# Patient Record
Sex: Female | Born: 1985 | Race: White | Hispanic: No | Marital: Single | Smoking: Never smoker
Health system: Southern US, Community
[De-identification: ages and names within clinical notes are randomized; demographics above are authoritative.]

## PROBLEM LIST (undated history)

## (undated) DIAGNOSIS — F509 Eating disorder, unspecified: Secondary | ICD-10-CM

## (undated) HISTORY — PX: BREAST SURGERY: SHX581

## (undated) HISTORY — PX: CHOLECYSTECTOMY: SHX55

---

## 2012-05-22 ENCOUNTER — Encounter (HOSPITAL_COMMUNITY): Payer: Self-pay | Admitting: Nurse Practitioner

## 2012-05-22 ENCOUNTER — Emergency Department (HOSPITAL_COMMUNITY): Payer: BC Managed Care – PPO

## 2012-05-22 ENCOUNTER — Emergency Department (HOSPITAL_COMMUNITY)
Admission: EM | Admit: 2012-05-22 | Discharge: 2012-05-23 | Disposition: A | Discharge status: Home / Self Care | Payer: BC Managed Care – PPO | Attending: Emergency Medicine | Admitting: Emergency Medicine

## 2012-05-22 DIAGNOSIS — Y9210 Unspecified residential institution as the place of occurrence of the external cause: Secondary | ICD-10-CM | POA: Insufficient documentation

## 2012-05-22 DIAGNOSIS — K8020 Calculus of gallbladder without cholecystitis without obstruction: Secondary | ICD-10-CM

## 2012-05-22 DIAGNOSIS — IMO0002 Reserved for concepts with insufficient information to code with codable children: Secondary | ICD-10-CM | POA: Insufficient documentation

## 2012-05-22 DIAGNOSIS — Y93B9 Activity, other involving muscle strengthening exercises: Secondary | ICD-10-CM | POA: Insufficient documentation

## 2012-05-22 DIAGNOSIS — Z8659 Personal history of other mental and behavioral disorders: Secondary | ICD-10-CM | POA: Insufficient documentation

## 2012-05-22 DIAGNOSIS — Z3202 Encounter for pregnancy test, result negative: Secondary | ICD-10-CM | POA: Insufficient documentation

## 2012-05-22 DIAGNOSIS — K8050 Calculus of bile duct without cholangitis or cholecystitis without obstruction: Secondary | ICD-10-CM

## 2012-05-22 DIAGNOSIS — R109 Unspecified abdominal pain: Secondary | ICD-10-CM | POA: Insufficient documentation

## 2012-05-22 HISTORY — DX: Eating disorder, unspecified: F50.9

## 2012-05-22 LAB — COMPREHENSIVE METABOLIC PANEL
Albumin: 4 g/dL (ref 3.5–5.2)
BUN: 11 mg/dL (ref 6–23)
Calcium: 9.4 mg/dL (ref 8.4–10.5)
Chloride: 101 mEq/L (ref 96–112)
Creatinine, Ser: 0.62 mg/dL (ref 0.50–1.10)
Total Bilirubin: 0.7 mg/dL (ref 0.3–1.2)
Total Protein: 7.1 g/dL (ref 6.0–8.3)

## 2012-05-22 LAB — URINALYSIS, ROUTINE W REFLEX MICROSCOPIC
Glucose, UA: NEGATIVE mg/dL
Hgb urine dipstick: NEGATIVE
Ketones, ur: NEGATIVE mg/dL
Protein, ur: NEGATIVE mg/dL
Urobilinogen, UA: 0.2 mg/dL (ref 0.0–1.0)

## 2012-05-22 LAB — CBC WITH DIFFERENTIAL/PLATELET
Basophils Relative: 0 % (ref 0–1)
Eosinophils Absolute: 0.1 10*3/uL (ref 0.0–0.7)
Eosinophils Relative: 1 % (ref 0–5)
HCT: 38.4 % (ref 36.0–46.0)
Hemoglobin: 12.9 g/dL (ref 12.0–15.0)
MCH: 29.7 pg (ref 26.0–34.0)
MCHC: 33.6 g/dL (ref 30.0–36.0)
MCV: 88.3 fL (ref 78.0–100.0)
Monocytes Absolute: 1 10*3/uL (ref 0.1–1.0)
Monocytes Relative: 9 % (ref 3–12)

## 2012-05-22 LAB — LIPASE, BLOOD: Lipase: 40 U/L (ref 11–59)

## 2012-05-22 MED ORDER — IBUPROFEN 200 MG PO TABS
400.0000 mg | ORAL_TABLET | Freq: Once | ORAL | Status: AC
  Administered 2012-05-22: 400 mg via ORAL
  Filled 2012-05-22: qty 1

## 2012-05-22 MED ORDER — ACETAMINOPHEN 500 MG PO TABS
1000.0000 mg | ORAL_TABLET | Freq: Once | ORAL | Status: AC
  Administered 2012-05-22: 1000 mg via ORAL
  Filled 2012-05-22: qty 2

## 2012-05-22 NOTE — Progress Notes (Signed)
Pt noted without pcp CM spoke with pt who confirms self pay [employer] resident. CM discussed and provided written information importance of pcp for f/u care, www.needymeds.org, discounted pharmacies, and other [place] resources such as financial assistance, DSS and  health department Reviewed Health connect number to assist with finding self pay provider close to pt's residence. Reviewed resources for [facility], general medical clinics, [facility] out patient [facility]ies, housing, and other resources in [place]. Pt voiced understanding and appreciation of resources provided

## 2012-05-22 NOTE — ED Notes (Signed)
US at bedside

## 2012-05-22 NOTE — ED Notes (Signed)
Patient transported to X-ray 

## 2012-05-22 NOTE — ED Notes (Signed)
Per EMS: Pt reported doing a handstand in her dorm room at [employer] when she suddenly felt a "tearing" in her chest.  Reported 3/10 to EMS.  She reports an allergy to Vicodin and denies any medical history.

## 2012-05-22 NOTE — Consult Note (Signed)
Reason for Consult:ABDOMINAL PAIN Referring Physician: [facility]  Erin Davenport is an 27 y.o. female.  HPI: Aske dto see patient at the request of Dr Verne Spurr for RUQ abdominal pain and gallstones by U/S.  The patient is pain free.   Had RUQ pain earlier today after some exercise.  Has GS on U/S.  Has had RUQ pain on / off for years with nausea.  No food trigger.  Past Medical History  Diagnosis Date  . Eating disorder     anorexia in highschool    History reviewed. No pertinent past surgical history.  Family History  Problem Relation Age of Onset  . Hypertension Mother   . Sleep apnea Mother   . Hypertension Father   . Heart disease Other     Social History:  reports that she has never smoked. She does not have any smokeless tobacco history on file. She reports that she does not drink alcohol or use illicit drugs.  Allergies:  Allergies  Allergen Reactions  . Vicodin (Hydrocodone-Acetaminophen) Nausea And Vomiting    Medications: I have reviewed the patient's current medications. Prior to Admission:  (Not in a hospital admission)  Results for orders placed during the hospital encounter of 05/22/12 (from the past 48 hour(s))  CBC WITH DIFFERENTIAL     Status: Abnormal   Collection Time   05/22/12  4:35 PM      Component Value Range Comment   WBC 11.4 (*) 4.0 - 10.5 K/uL    RBC 4.35  3.87 - 5.11 MIL/uL    Hemoglobin 12.9  12.0 - 15.0 g/dL    HCT 98.1  19.1 - 47.8 %    MCV 88.3  78.0 - 100.0 fL    MCH 29.7  26.0 - 34.0 pg    MCHC 33.6  30.0 - 36.0 g/dL    RDW 29.5  62.1 - 30.8 %    Platelets 242  150 - 400 K/uL    Neutrophils Relative 68  43 - 77 %    Neutro Abs 7.7  1.7 - 7.7 K/uL    Lymphocytes Relative 22  12 - 46 %    Lymphs Abs 2.5  0.7 - 4.0 K/uL    Monocytes Relative 9  3 - 12 %    Monocytes Absolute 1.0  0.1 - 1.0 K/uL    Eosinophils Relative 1  0 - 5 %    Eosinophils Absolute 0.1  0.0 - 0.7 K/uL    Basophils Relative 0  0 - 1 %    Basophils Absolute 0.0   0.0 - 0.1 K/uL   COMPREHENSIVE METABOLIC PANEL     Status: Abnormal   Collection Time   05/22/12  4:35 PM      Component Value Range Comment   Sodium 138  135 - 145 mEq/L    Potassium 4.5  3.5 - 5.1 mEq/L    Chloride 101  96 - 112 mEq/L    CO2 27  19 - 32 mEq/L    Glucose, Bld 104 (*) 70 - 99 mg/dL    BUN 11  6 - 23 mg/dL    Creatinine, Ser 6.57  0.50 - 1.10 mg/dL    Calcium 9.4  8.4 - 84.6 mg/dL    Total Protein 7.1  6.0 - 8.3 g/dL    Albumin 4.0  3.5 - 5.2 g/dL    AST 962 (*) 0 - 37 U/L    ALT 86 (*) 0 - 35 U/L    Alkaline  Phosphatase 86  39 - 117 U/L    Total Bilirubin 0.7  0.3 - 1.2 mg/dL    GFR calc non Af Amer >90  >90 mL/min    GFR calc Af Amer >90  >90 mL/min   LIPASE, BLOOD     Status: Normal   Collection Time   05/22/12  4:35 PM      Component Value Range Comment   Lipase 40  11 - 59 U/L   URINALYSIS, ROUTINE W REFLEX MICROSCOPIC     Status: Abnormal   Collection Time   05/22/12  5:55 PM      Component Value Range Comment   Color, Urine YELLOW  YELLOW    APPearance CLOUDY (*) CLEAR    Specific Gravity, Urine 1.011  1.005 - 1.030    pH 7.5  5.0 - 8.0    Glucose, UA NEGATIVE  NEGATIVE mg/dL    Hgb urine dipstick NEGATIVE  NEGATIVE    Bilirubin Urine NEGATIVE  NEGATIVE    Ketones, ur NEGATIVE  NEGATIVE mg/dL    Protein, ur NEGATIVE  NEGATIVE mg/dL    Urobilinogen, UA 0.2  0.0 - 1.0 mg/dL    Nitrite NEGATIVE  NEGATIVE    Leukocytes, UA NEGATIVE  NEGATIVE MICROSCOPIC NOT DONE ON URINES WITH NEGATIVE PROTEIN, BLOOD, LEUKOCYTES, NITRITE, OR GLUCOSE <1000 mg/dL.  PREGNANCY, URINE     Status: Normal   Collection Time   05/22/12  5:55 PM      Component Value Range Comment   Preg Test, Ur NEGATIVE  NEGATIVE     US Abdomen Complete  05/22/2012  *RADIOLOGY REPORT*  Clinical Data:  27 year old female with abdominal pain and elevated LFTs.  ABDOMINAL ULTRASOUND COMPLETE  Comparison:  None  Findings:  Gallbladder: Multiple mobile gallstones are identified, the largest  measuring 2 cm in diameter.  Mild gallbladder wall thickening and equivocal sonographic Murphy's sign noted.  There is no evidence of pericholecystic fluid.  Common Bile Duct:  There is no evidence of intrahepatic or extrahepatic biliary dilation. The CBD measures 3.0 mm in greatest diameter.  Liver:  The liver is within normal limits in parenchymal echogenicity. No focal abnormalities are identified.  IVC:  Appears normal.  Pancreas:  Although the pancreas is difficult to visualize in its entirety, no focal pancreatic abnormality is identified.  Spleen:  Within normal limits in size and echotexture.  Right kidney:  The right kidney is normal in size and parenchymal echogenicity.  There is no evidence of solid mass, hydronephrosis or definite renal calculi.  The right kidney measures 11.1 cm.  Left kidney:  The left kidney is normal in size and parenchymal echogenicity.  There is no evidence of solid mass, hydronephrosis or definite renal calculi.   The left kidney measures 10.3 cm.  Abdominal Aorta:  No abdominal aortic aneurysm identified.  There is no evidence of ascites.  IMPRESSION: Cholelithiasis with mild gallbladder wall thickening and questionable sonographic Murphy's sign - equivocal for acute cholecystitis. No evidence of biliary dilatation.   Original Report Authenticated By: Harmon Pier, M.D.    Dg Abd Acute W/chest  05/22/2012  *RADIOLOGY REPORT*  Clinical Data: Abdominal pain  ACUTE ABDOMEN SERIES (ABDOMEN 2 VIEW & CHEST 1 VIEW)  Comparison: None.  Findings: Normal heart size and vascularity.  Lungs clear.  No effusions or pneumothorax.  Trachea is midline.  No free air. Moderate retained stool throughout the colon compatible with constipation.  Negative for obstruction or free air.  No abnormal calcification or  osseous abnormality.  Pelvis appears intact.  IMPRESSION: Moderate colonic stool burden compatible with constipation.  Negative for obstruction or free air.  No acute chest process.    Original Report Authenticated By: Judie Petit. Miles Costain, M.D.     Review of Systems  Constitutional: Negative.   HENT: Negative.   Respiratory: Negative.   Cardiovascular: Negative.   Gastrointestinal: Positive for nausea, vomiting and abdominal pain.  Genitourinary: Negative.   Musculoskeletal: Negative.   Skin: Negative.   Neurological: Negative.   Endo/Heme/Allergies: Negative.   Psychiatric/Behavioral: Negative.    Blood pressure 98/60, pulse 64, temperature 98.5 F (36.9 C), temperature source Oral, resp. rate 18, last menstrual period 05/11/2012, SpO2 97.00%. Physical Exam  Constitutional: She is oriented to person, place, and time. She appears well-developed and well-nourished.  HENT:  Head: Normocephalic and atraumatic.  Eyes: EOM are normal. Pupils are equal, round, and reactive to light.  Neck: Normal range of motion.  GI: Soft. Bowel sounds are normal. She exhibits no distension. There is no tenderness.  Musculoskeletal: Normal range of motion.  Neurological: She is alert and oriented to person, place, and time.  Skin: Skin is warm and dry.  Psychiatric: She has a normal mood and affect. Her behavior is normal. Judgment and thought content normal.    Assessment/Plan: Biliary colic Asymptomatic at this point.  Wants surgery in [place] closer to family.  OK to D/C  And have pt follow up closer to home for elective cholecystectomy.  Return if symptoms return.  Low fat diet.  Xaria Judon A. 05/22/2012, 10:56 PM

## 2012-05-22 NOTE — ED Provider Notes (Signed)
History     CSN: [id number]  Arrival date & time 05/22/12  1611   First MD Initiated Contact with Patient 05/22/12 1612      Chief Complaint  Patient presents with  . Abdominal Injury     HPI Pt was seen at 1625.   Per pt, c/o gradual onset and persistence of constant upper abd "pain" that began this afternoon PTA.  States the pain began after she was "doing a handstand" and "hanging abd crunches" in her dorm room.  Describes the abd pain as "sore."  Denies N/V/D, no fevers, no back pain, no rash, no CP/SOB, no black or blood in stools or emesis.       Past Medical History  Diagnosis Date  . Eating disorder     anorexia in highschool    History reviewed. No pertinent past surgical history.  Family History  Problem Relation Age of Onset  . Hypertension Mother   . Sleep apnea Mother   . Hypertension Father   . Heart disease Other     History  Substance Use Topics  . Smoking status: Never Smoker   . Smokeless tobacco: Not on file  . Alcohol Use: No      Review of Systems ROS: Statement: All systems negative except as marked or noted in the HPI; Constitutional: Negative for fever and chills. ; ; Eyes: Negative for eye pain, redness and discharge. ; ; ENMT: Negative for ear pain, hoarseness, nasal congestion, sinus pressure and sore throat. ; ; Cardiovascular: Negative for chest pain, palpitations, diaphoresis, dyspnea and peripheral edema. ; ; Respiratory: Negative for cough, wheezing and stridor. ; ; Gastrointestinal: +abd pain. Negative for nausea, vomiting, diarrhea, blood in stool, hematemesis, jaundice and rectal bleeding. . ; ; Genitourinary: Negative for dysuria, flank pain and hematuria. ; ; Musculoskeletal: Negative for back pain and neck pain. Negative for swelling and trauma.; ; Skin: Negative for pruritus, rash, abrasions, blisters, bruising and skin lesion.; ; Neuro: Negative for headache, lightheadedness and neck stiffness. Negative for weakness, altered level  of consciousness , altered mental status, extremity weakness, paresthesias, involuntary movement, seizure and syncope.       Allergies  Vicodin  Home Medications  No current outpatient prescriptions on file.  BP 104/63  Pulse 67  Temp 98.5 F (36.9 C) (Oral)  Resp 21  SpO2 100%  LMP 05/11/2012  Physical Exam 1630: Physical examination:  Nursing notes reviewed; Vital signs and O2 SAT reviewed;  Constitutional: Well developed, Well nourished, Well hydrated, In no acute distress; Head:  Normocephalic, atraumatic; Eyes: EOMI, PERRL, No scleral icterus; ENMT: Mouth and pharynx normal, Mucous membranes moist; Neck: Supple, Full range of motion, No lymphadenopathy; Cardiovascular: Regular rate and rhythm, No murmur, rub, or gallop; Respiratory: Breath sounds clear & equal bilaterally, No rales, rhonchi, wheezes.  Speaking full sentences with ease, Normal respiratory effort/excursion; Chest: Nontender, Movement normal; Abdomen: Soft, +mild RLQ, mid-epigastric, LUQ tenderness to palp. No rebound or guarding. Nondistended, Normal bowel sounds; Genitourinary: No CVA tenderness; Extremities: Pulses normal, No tenderness, No edema, No calf edema or asymmetry.; Neuro: AA&Ox3, Major CN grossly intact.  Speech clear. No gross focal motor or sensory deficits in extremities.; Skin: Color normal, Warm, Dry.   ED Course  Procedures    MDM  MDM Reviewed: nursing note and vitals Interpretation: labs and ultrasound     Results for orders placed during the hospital encounter of 05/22/12  URINALYSIS, ROUTINE W REFLEX MICROSCOPIC      Component Value Range  Color, Urine YELLOW  YELLOW   APPearance CLOUDY (*) CLEAR   Specific Gravity, Urine 1.011  1.005 - 1.030   pH 7.5  5.0 - 8.0   Glucose, UA NEGATIVE  NEGATIVE mg/dL   Hgb urine dipstick NEGATIVE  NEGATIVE   Bilirubin Urine NEGATIVE  NEGATIVE   Ketones, ur NEGATIVE  NEGATIVE mg/dL   Protein, ur NEGATIVE  NEGATIVE mg/dL   Urobilinogen, UA 0.2   0.0 - 1.0 mg/dL   Nitrite NEGATIVE  NEGATIVE   Leukocytes, UA NEGATIVE  NEGATIVE  PREGNANCY, URINE      Component Value Range   Preg Test, Ur NEGATIVE  NEGATIVE  CBC WITH DIFFERENTIAL      Component Value Range   WBC 11.4 (*) 4.0 - 10.5 K/uL   RBC 4.35  3.87 - 5.11 MIL/uL   Hemoglobin 12.9  12.0 - 15.0 g/dL   HCT 04.5  40.9 - 81.1 %   MCV 88.3  78.0 - 100.0 fL   MCH 29.7  26.0 - 34.0 pg   MCHC 33.6  30.0 - 36.0 g/dL   RDW 91.4  78.2 - 95.6 %   Platelets 242  150 - 400 K/uL   Neutrophils Relative 68  43 - 77 %   Neutro Abs 7.7  1.7 - 7.7 K/uL   Lymphocytes Relative 22  12 - 46 %   Lymphs Abs 2.5  0.7 - 4.0 K/uL   Monocytes Relative 9  3 - 12 %   Monocytes Absolute 1.0  0.1 - 1.0 K/uL   Eosinophils Relative 1  0 - 5 %   Eosinophils Absolute 0.1  0.0 - 0.7 K/uL   Basophils Relative 0  0 - 1 %   Basophils Absolute 0.0  0.0 - 0.1 K/uL  COMPREHENSIVE METABOLIC PANEL      Component Value Range   Sodium 138  135 - 145 mEq/L   Potassium 4.5  3.5 - 5.1 mEq/L   Chloride 101  96 - 112 mEq/L   CO2 27  19 - 32 mEq/L   Glucose, Bld 104 (*) 70 - 99 mg/dL   BUN 11  6 - 23 mg/dL   Creatinine, Ser 2.13  0.50 - 1.10 mg/dL   Calcium 9.4  8.4 - 08.6 mg/dL   Total Protein 7.1  6.0 - 8.3 g/dL   Albumin 4.0  3.5 - 5.2 g/dL   AST 578 (*) 0 - 37 U/L   ALT 86 (*) 0 - 35 U/L   Alkaline Phosphatase 86  39 - 117 U/L   Total Bilirubin 0.7  0.3 - 1.2 mg/dL   GFR calc non Af Amer >90  >90 mL/min   GFR calc Af Amer >90  >90 mL/min  LIPASE, BLOOD      Component Value Range   Lipase 40  11 - 59 U/L   US Abdomen Complete 05/22/2012  *RADIOLOGY REPORT*  Clinical Data:  27 year old female with abdominal pain and elevated LFTs.  ABDOMINAL ULTRASOUND COMPLETE  Comparison:  None  Findings:  Gallbladder: Multiple mobile gallstones are identified, the largest measuring 2 cm in diameter.  Mild gallbladder wall thickening and equivocal sonographic Murphy's sign noted.  There is no evidence of pericholecystic  fluid.  Common Bile Duct:  There is no evidence of intrahepatic or extrahepatic biliary dilation. The CBD measures 3.0 mm in greatest diameter.  Liver:  The liver is within normal limits in parenchymal echogenicity. No focal abnormalities are identified.  IVC:  Appears normal.  Pancreas:  Although the pancreas is difficult to visualize in its entirety, no focal pancreatic abnormality is identified.  Spleen:  Within normal limits in size and echotexture.  Right kidney:  The right kidney is normal in size and parenchymal echogenicity.  There is no evidence of solid mass, hydronephrosis or definite renal calculi.  The right kidney measures 11.1 cm.  Left kidney:  The left kidney is normal in size and parenchymal echogenicity.  There is no evidence of solid mass, hydronephrosis or definite renal calculi.   The left kidney measures 10.3 cm.  Abdominal Aorta:  No abdominal aortic aneurysm identified.  There is no evidence of ascites.  IMPRESSION: Cholelithiasis with mild gallbladder wall thickening and questionable sonographic Murphy's sign - equivocal for acute cholecystitis. No evidence of biliary dilatation.   Original Report Authenticated By: Harmon Pier, M.D.    Dg Abd Acute W/chest 05/22/2012  *RADIOLOGY REPORT*  Clinical Data: Abdominal pain  ACUTE ABDOMEN SERIES (ABDOMEN 2 VIEW & CHEST 1 VIEW)  Comparison: None.  Findings: Normal heart size and vascularity.  Lungs clear.  No effusions or pneumothorax.  Trachea is midline.  No free air. Moderate retained stool throughout the colon compatible with constipation.  Negative for obstruction or free air.  No abnormal calcification or osseous abnormality.  Pelvis appears intact.  IMPRESSION: Moderate colonic stool burden compatible with constipation.  Negative for obstruction or free air.  No acute chest process.   Original Report Authenticated By: Judie Petit. Shick, M.D.      2200:  LFT's mildly elevated, no old to compare.  [place] equivocal for acute cholecystitis.  VSS,  afebrile, abd benign on re-exam. Has tol PO well while in the ED without N/V.  Further d/w pt regarding her symptoms: pt states she has had intermittent upper abd pain "for years," usually associated with nausea; has adjusted her diet (avoiding greasy/fatty/fried and acidic foods).  Dx and testing d/w pt.  Questions answered.  Verb understanding, agreeable to eval by General Surgeon while in the ED.  T/C to General Surgeon Dr. Luisa Hart, case discussed, including:  HPI, pertinent PM/SHx, VS/PE, dx testing, ED course and treatment:  Agreeable to eval in the ED.   2230:  Surgery has eval in ED:  States pt wants to go home to [place] to have elective cholecystectomy, OK to d/c to f/u as outpt.        Laray Anger, DO 05/24/12 1343

## 2012-05-22 NOTE — ED Notes (Signed)
Bedside report received from previous RN 

## 2012-05-23 MED ORDER — TRAMADOL HCL 50 MG PO TABS: 50.0000 mg | ORAL_TABLET | Freq: Four times a day (QID) | ORAL | Status: AC | PRN

## 2012-05-23 MED ORDER — ONDANSETRON HCL 4 MG PO TABS: 4.0000 mg | ORAL_TABLET | Freq: Three times a day (TID) | ORAL | Status: AC | PRN

## 2012-05-30 ENCOUNTER — Ambulatory Visit (INDEPENDENT_AMBULATORY_CARE_PROVIDER_SITE_OTHER): Payer: BC Managed Care – PPO | Admitting: Surgery

## 2013-06-04 ENCOUNTER — Encounter (HOSPITAL_COMMUNITY): Payer: Self-pay | Admitting: Emergency Medicine

## 2013-06-04 ENCOUNTER — Emergency Department (INDEPENDENT_AMBULATORY_CARE_PROVIDER_SITE_OTHER): Payer: BC Managed Care – PPO

## 2013-06-04 ENCOUNTER — Emergency Department (INDEPENDENT_AMBULATORY_CARE_PROVIDER_SITE_OTHER)
Admission: EM | Admit: 2013-06-04 | Discharge: 2013-06-04 | Disposition: A | Discharge status: Home / Self Care | Payer: BC Managed Care – PPO | Source: Home / Self Care | Attending: Family Medicine | Admitting: Family Medicine

## 2013-06-04 DIAGNOSIS — S7010XA Contusion of unspecified thigh, initial encounter: Secondary | ICD-10-CM

## 2013-06-04 DIAGNOSIS — S7012XA Contusion of left thigh, initial encounter: Secondary | ICD-10-CM

## 2013-06-04 NOTE — Discharge Instructions (Signed)
Ice and advil as needed, activity as tolerated. Return as needed.

## 2013-06-04 NOTE — ED Notes (Signed)
Placed in a gown
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
